# Patient Record
Sex: Female | Born: 1985 | Race: White | Hispanic: No | Marital: Married | Smoking: Never smoker
Health system: Southern US, Community
[De-identification: ages and names within clinical notes are randomized; demographics above are authoritative.]

## PROBLEM LIST (undated history)

## (undated) DIAGNOSIS — E079 Disorder of thyroid, unspecified: Secondary | ICD-10-CM

---

## 2011-07-26 ENCOUNTER — Ambulatory Visit: Payer: Self-pay | Admitting: Pediatrics

## 2012-06-09 ENCOUNTER — Emergency Department: Payer: Self-pay | Admitting: *Deleted

## 2019-06-29 ENCOUNTER — Ambulatory Visit
Admission: EM | Admit: 2019-06-29 | Discharge: 2019-06-29 | Disposition: A | Discharge status: Home / Self Care | Payer: 59 | Attending: Emergency Medicine | Admitting: Emergency Medicine

## 2019-06-29 ENCOUNTER — Other Ambulatory Visit: Payer: Self-pay

## 2019-06-29 DIAGNOSIS — Y288XXA Contact with other sharp object, undetermined intent, initial encounter: Secondary | ICD-10-CM | POA: Diagnosis not present

## 2019-06-29 DIAGNOSIS — Z23 Encounter for immunization: Secondary | ICD-10-CM | POA: Diagnosis not present

## 2019-06-29 DIAGNOSIS — S61215A Laceration without foreign body of left ring finger without damage to nail, initial encounter: Secondary | ICD-10-CM | POA: Diagnosis not present

## 2019-06-29 HISTORY — DX: Disorder of thyroid, unspecified: E07.9

## 2019-06-29 MED ORDER — TETANUS-DIPHTH-ACELL PERTUSSIS 5-2.5-18.5 LF-MCG/0.5 IM SUSP
0.5000 mL | Freq: Once | INTRAMUSCULAR | Status: AC
  Administered 2019-06-29: 15:00:00 0.5 mL via INTRAMUSCULAR

## 2019-06-29 MED ORDER — MUPIROCIN 2 % EX OINT: 1.0000 "application " | TOPICAL_OINTMENT | Freq: Three times a day (TID) | CUTANEOUS | 0 refills | Status: DC

## 2019-06-29 NOTE — Discharge Instructions (Signed)
Keep dry for 24 hours.  Apply mupirocin ointment 3 times daily to the area until sutures are removed in 10 days.  Return to our clinic immediately if any of the signs or symptoms of infection that we discussed occur

## 2019-06-29 NOTE — ED Triage Notes (Signed)
Left hand third digit laceration occurring today. No bleeding at this time. Pt unsure when last tetanus shot was. Cut on grill.

## 2019-06-29 NOTE — ED Provider Notes (Signed)
[facility]    CSN: [id number] Arrival date & time: 06/29/19  1253      History   Chief Complaint Chief Complaint  Patient presents with  . Extremity Laceration    HPI Diane Blankenship is a 33 y.o. female.   HPI  33 year old female presents today after sustaining a laceration to the dorsum of her left nondominant third digit on a smoker grill.  It measures approximately 6 mm with a depth of approximately 1 to 2 mm.  She is not current on her tetanus.       Past Medical History:  Diagnosis Date  . Thyroid disease     There are no active problems to display for this patient.   History reviewed. No pertinent surgical history.  OB History   No obstetric history on file.      Home Medications    Prior to Admission medications   Medication Sig Start Date End Date Taking? Authorizing Provider  mupirocin ointment (BACTROBAN) 2 % Apply 1 application topically 3 (three) times daily. 06/29/19   Lutricia Feiloemer, William P, PA-C    Family History History reviewed. No pertinent family history.  Social History Social History   Tobacco Use  . Smoking status: Not on file  Substance Use Topics  . Alcohol use: Yes    Comment: occasionally  . Drug use: Not on file     Allergies   Patient has no known allergies.   Review of Systems Review of Systems  Constitutional: Positive for activity change. Negative for appetite change, chills, diaphoresis, fatigue and fever.  Skin: Positive for wound.  All other systems reviewed and are negative.    Physical Exam Triage Vital Signs ED Triage Vitals [06/29/19 1332]  Enc Vitals Group     BP (!) 158/104     Pulse Rate (!) 52     Resp 18     Temp 98.2 F (36.8 C)     Temp Source Oral     SpO2 100 %     Weight 220 lb (99.8 kg)     Height 5\' 9"  (1.753 m)     Head Circumference      Peak Flow      Pain Score 4     Pain Loc      Pain Edu?      Excl. in [place]?    No data found.  Updated Vital Signs BP (!)  158/104 (BP Location: Right Arm)   Pulse (!) 52   Temp 98.2 F (36.8 C) (Oral)   Resp 18   Ht 5\' 9"  (1.753 m)   Wt 220 lb (99.8 kg)   SpO2 100%   BMI 32.49 kg/m   Visual Acuity Right Eye Distance:   Left Eye Distance:   Bilateral Distance:    Right Eye Near:   Left Eye Near:    Bilateral Near:     Physical Exam Vitals signs and nursing note reviewed.  Constitutional:      General: She is not in acute distress.    Appearance: Normal appearance. She is not ill-appearing, toxic-appearing or diaphoretic.  HENT:     Head: Normocephalic and atraumatic.  Eyes:     Conjunctiva/sclera: Conjunctivae normal.  Neck:     Musculoskeletal: Normal range of motion and neck supple.  Cardiovascular:     Rate and Rhythm: Normal rate and regular rhythm.     Pulses: Normal pulses.     Heart sounds: Normal heart sounds.  Pulmonary:  Effort: Pulmonary effort is normal.     Breath sounds: Normal breath sounds.  Musculoskeletal: Normal range of motion.        General: Tenderness and signs of injury present.  Skin:    General: Skin is warm and dry.     Comments: Examination of the left nondominant ring finger overlying the middle interphalangeal joint radial aspect.  There is a 6 mm laceration extending approximate 1 mm into the joint itself.  She has a good range of motion of the joint.  To maintain full extension against resistance.  There is no apparent foreign body seen.  Neurovascular function is intact distally.  Neurological:     General: No focal deficit present.     Mental Status: She is alert and oriented to person, place, and time.  Psychiatric:        Mood and Affect: Mood normal.        Behavior: Behavior normal.        Thought Content: Thought content normal.        Judgment: Judgment normal.      UC Treatments / Results  Labs (all labs ordered are listed, but only abnormal results are displayed) Labs Reviewed - No data to display  EKG   Radiology No results  found. .6 Procedures Laceration Repair  Date/Time: 06/29/2019 2:40 PM Performed by: Lutricia Feil, PA-C Authorized by: Lutricia Feil, PA-C   Consent:    Consent obtained:  Verbal   Consent given by:  Patient   Risks discussed:  Infection Anesthesia (see MAR for exact dosages):    Anesthesia method:  Local infiltration   Local anesthetic:  Lidocaine 1% w/o epi Laceration details:    Location:  Finger   Finger location:  L ring finger   Length (cm):  0.6   Depth (mm):  1 Repair type:    Repair type:  Simple Pre-procedure details:    Preparation:  Patient was prepped and draped in usual sterile fashion Exploration:    Hemostasis achieved with:  Direct pressure   Wound exploration: wound explored through full range of motion and entire depth of wound probed and visualized     Contaminated: no   Treatment:    Area cleansed with:  Betadine   Amount of cleaning:  Standard   Irrigation solution:  Sterile saline   Irrigation volume:  60   Irrigation method:  Pressure wash   Visualized foreign bodies/material removed: no   Skin repair:    Repair method:  Sutures   Suture size:  5-0   Suture material:  Prolene   Number of sutures:  2 Approximation:    Approximation:  Close Post-procedure details:    Dressing:  Antibiotic ointment, non-adherent dressing and sterile dressing   Patient tolerance of procedure:  Tolerated well, no immediate complications Comments:     Refer to discharge instructions   (including critical care time)  Medications Ordered in UC Medications  Tdap (BOOSTRIX) injection 0.5 mL (0.5 mLs Intramuscular Given 06/29/19 1435)    Initial Impression / Assessment and Plan / UC Course  I have reviewed the triage vital signs and the nursing notes.  Pertinent labs & imaging results that were available during my care of the patient were reviewed by me and considered in my medical decision making (see chart for details).   33 year old female sustained a  laceration overlying the middle   interphalangeal joint.  Laceration is approximately 6 mm in length with 1 6 mm of  depth.  He was given the option of a adhesive repair versus suturing.  She chose suturing.  It was accomplished without incident please refer to the procedural note.  She was told to keep the area dry for 24 hours.  Then she will begin a normal washing program with application of mupirocin ointment 3 times daily until the sutures are removed in 10 days.  Signs and symptoms of infection were outlined in detail to the patient.  She will return to our clinic or go to the emergency department if any these occur.   Final Clinical Impressions(s) / UC Diagnoses   Final diagnoses:  Laceration of left ring finger without foreign body without damage to nail, initial encounter     Discharge Instructions     Keep dry for 24 hours.  Apply mupirocin ointment 3 times daily to the area until sutures are removed in 10 days.  Return to our clinic immediately if any of the signs or symptoms of infection that we discussed occur    ED Prescriptions    Medication Sig Dispense Auth. Provider   mupirocin ointment (BACTROBAN) 2 % Apply 1 application topically 3 (three) times daily. 22 g Lorin Picket, PA-C     PDMP not reviewed this encounter.   Lorin Picket, PA-C 06/29/19 1525

## 2020-02-09 ENCOUNTER — Other Ambulatory Visit: Payer: Self-pay

## 2020-02-09 ENCOUNTER — Ambulatory Visit
Admission: EM | Admit: 2020-02-09 | Discharge: 2020-02-09 | Disposition: A | Discharge status: Home / Self Care | Payer: 59 | Attending: Family Medicine | Admitting: Family Medicine

## 2020-02-09 ENCOUNTER — Ambulatory Visit (INDEPENDENT_AMBULATORY_CARE_PROVIDER_SITE_OTHER): Payer: 59

## 2020-02-09 ENCOUNTER — Encounter: Payer: Self-pay | Admitting: Emergency Medicine

## 2020-02-09 DIAGNOSIS — M25562 Pain in left knee: Secondary | ICD-10-CM | POA: Diagnosis present

## 2020-02-09 DIAGNOSIS — N760 Acute vaginitis: Secondary | ICD-10-CM | POA: Diagnosis present

## 2020-02-09 DIAGNOSIS — B9689 Other specified bacterial agents as the cause of diseases classified elsewhere: Secondary | ICD-10-CM | POA: Diagnosis present

## 2020-02-09 DIAGNOSIS — A5901 Trichomonal vulvovaginitis: Secondary | ICD-10-CM | POA: Insufficient documentation

## 2020-02-09 LAB — WET PREP, GENITAL
Sperm: NONE SEEN
Yeast Wet Prep HPF POC: NONE SEEN

## 2020-02-09 MED ORDER — METRONIDAZOLE 500 MG PO TABS: 500.0000 mg | ORAL_TABLET | Freq: Two times a day (BID) | ORAL | 0 refills | Status: DC

## 2020-02-09 MED ORDER — MELOXICAM 15 MG PO TABS: 15.0000 mg | ORAL_TABLET | Freq: Every day | ORAL | 0 refills | Status: DC | PRN

## 2020-02-09 NOTE — Discharge Instructions (Signed)
Rest, ice, elevation.  Medication as prescribed.  If continues to be bothersome, see orthopedics.  [facility] Orthopedics [phone]) OR [facility] [phone])  Take care  Dr. Adriana Simas

## 2020-02-09 NOTE — ED Provider Notes (Signed)
[facility]    CSN: [id number] Arrival date & time: 02/09/20  1327      History   Chief Complaint Chief Complaint  Patient presents with  . Knee Pain  . Vaginal Discharge   HPI  34 year old female presents with the above complaints.  Patient reports that she fell through her deck last night.  She states that her left leg fell to the deck.  She injured her left knee and lower leg.  She reports mild swelling and pain.  Pain 3/10 in severity.  She has rested, iced the area with some improvement.  She has an abrasion to the left lower leg.  Additionally, patient reports a 2-week history of vaginal discharge.  Denies itching, pain, odor.  Denies concerns for STDs.  Patient has had trichomonas in the past.  She has had 3 separate encounters where trichomonas was noted.  Testing was negative on 2/25 at the time of IUD insertion.  Patient reports that both she and her partner were treated.  No other complaints at this time.  Past Medical History:  Diagnosis Date  . Thyroid disease     Home Medications    Prior to Admission medications   Medication Sig Start Date End Date Taking? Authorizing Provider  clonazePAM (KLONOPIN) 0.25 MG disintegrating tablet Take by mouth. 07/31/16  Yes [provider]  levonorgestrel (MIRENA) 20 MCG/24HR IUD by Intrauterine route. 11/26/19  Yes [provider]  levothyroxine (SYNTHROID) 75 MCG tablet Take by mouth. 03/13/17  Yes [provider]  sertraline (ZOLOFT) 50 MG tablet Take by mouth. 01/07/20  Yes [provider]  SUMAtriptan (IMITREX) 25 MG tablet TAKE 1 TAB BY MOUTH ONCE AS NEEDED FOR MIGRAINE. MAY REPEAT DOSE AFTER 2 HOURS IF NEEDED 08/05/17  Yes [provider]  traZODone (DESYREL) 100 MG tablet TAKE 1 TABLET BY MOUTH EVERY DAY AT NIGHT 07/28/17  Yes [provider]  meloxicam (MOBIC) 15 MG tablet Take 1 tablet (15 mg total) by mouth daily as needed for pain. 02/09/20   Tommie Sams, DO    metroNIDAZOLE (FLAGYL) 500 MG tablet Take 1 tablet (500 mg total) by mouth 2 (two) times daily. 02/09/20   Tommie Sams, DO  mupirocin ointment (BACTROBAN) 2 % Apply 1 application topically 3 (three) times daily. 06/29/19   Lutricia Feil, PA-C    Social History Social History   Tobacco Use  . Smoking status: Never Smoker  . Smokeless tobacco: Never Used  Substance Use Topics  . Alcohol use: Yes    Comment: occasionally  . Drug use: Never     Allergies   Patient has no known allergies.   Review of Systems Review of Systems  Genitourinary: Positive for vaginal discharge.  Musculoskeletal:       Left knee pain.   Physical Exam Triage Vital Signs ED Triage Vitals  Enc Vitals Group     BP 02/09/20 1343 (!) 158/111     Pulse Rate 02/09/20 1343 66     Resp 02/09/20 1343 18     Temp 02/09/20 1343 98.4 F (36.9 C)     Temp Source 02/09/20 1343 Oral     SpO2 02/09/20 1343 98 %     Weight 02/09/20 1341 230 lb (104.3 kg)     Height 02/09/20 1341 5\' 8"  (1.727 m)     Head Circumference --      Peak Flow --      Pain Score 02/09/20 1340 3  Pain Loc --      Pain Edu? --      Excl. in [place]? --    Updated Vital Signs BP (!) 158/111 (BP Location: Left Arm)   Pulse 66   Temp 98.4 F (36.9 C) (Oral)   Resp 18   Ht 5\' 8"  (1.727 m)   Wt 104.3 kg   SpO2 98%   BMI 34.97 kg/m   Visual Acuity Right Eye Distance:   Left Eye Distance:   Bilateral Distance:    Right Eye Near:   Left Eye Near:    Bilateral Near:     Physical Exam Vitals and nursing note reviewed.  Constitutional:      General: She is not in acute distress.    Appearance: Normal appearance. She is not ill-appearing.  HENT:     Head: Normocephalic and atraumatic.  Eyes:     General:        Right eye: No discharge.        Left eye: No discharge.     Conjunctiva/sclera: Conjunctivae normal.  Pulmonary:     Effort: Pulmonary effort is normal. No respiratory distress.  Musculoskeletal:      Comments: Left knee -ligaments intact.  No joint line tenderness.  Mild swelling.  Abrasions noted anteriorly.  Neurological:     Mental Status: She is alert.  Psychiatric:        Mood and Affect: Mood normal.        Behavior: Behavior normal.    UC Treatments / Results  Labs (all labs ordered are listed, but only abnormal results are displayed) Labs Reviewed  WET PREP, GENITAL - Abnormal; Notable for the following components:      Result Value   Trich, Wet Prep PRESENT (*)    Clue Cells Wet Prep HPF POC PRESENT (*)    WBC, Wet Prep HPF POC FEW (*)    All other components within normal limits    EKG  Radiology   Procedures Procedures (including critical care time)  Medications Ordered in UC Medications - No data to display  Initial Impression / Assessment and Plan / UC Course  I have reviewed the triage vital signs and the nursing notes.  Pertinent labs & imaging results that were available during my care of the patient were reviewed by me and considered in my medical decision making (see chart for details).    34 year old female presents with acute left knee pain after injury, bacterial vaginosis, and trichomonas vaginitis.  X-ray obtained and reviewed independently by me.  Interpretation: No acute fractures of the tibia, fibula, or knee.  Meloxicam as needed for pain.  Advised rest, ice, elevation.  Treating with Flagyl for trichomonas and bacterial vaginosis.  Advised no intercourse until she has been treated and her partner has been tested and treated.  Needs follow-up.  Supportive care.  Final Clinical Impressions(s) / UC Diagnoses   Final diagnoses:  Acute pain of left knee  BV (bacterial vaginosis)  Trichomonas vaginitis     Discharge Instructions     Rest, ice, elevation.  Medication as prescribed.  If continues to be bothersome, see orthopedics.  [place] [phone]) OR [facility] [phone])  Take care  Dr. Lacinda Axon      ED  Prescriptions    Medication Sig Dispense Auth. Provider   metroNIDAZOLE (FLAGYL) 500 MG tablet Take 1 tablet (500 mg total) by mouth 2 (two) times daily. 14 tablet Caramia Boutin G, DO   meloxicam (MOBIC) 15 MG  tablet Take 1 tablet (15 mg total) by mouth daily as needed for pain. 30 tablet Tommie Sams, DO     PDMP not reviewed this encounter.   Tommie Sams, [place] 02/09/20 [phone]

## 2020-02-09 NOTE — ED Triage Notes (Signed)
Patient states she fell through her deck last night injuring her left knee. She states she is having pain with walking. She also is c/o vaginal discharge that started 1.5 weeks ago. She denies itching, denies pain, denies odor. Patient has no concerns for STDS.

## 2020-06-27 ENCOUNTER — Other Ambulatory Visit: Payer: Self-pay | Admitting: Nephrology

## 2020-06-27 ENCOUNTER — Other Ambulatory Visit (HOSPITAL_COMMUNITY): Payer: Self-pay | Admitting: Nephrology

## 2020-06-27 DIAGNOSIS — R809 Proteinuria, unspecified: Secondary | ICD-10-CM

## 2020-06-27 DIAGNOSIS — N182 Chronic kidney disease, stage 2 (mild): Secondary | ICD-10-CM

## 2020-07-06 ENCOUNTER — Ambulatory Visit: Payer: 59

## 2020-07-13 ENCOUNTER — Other Ambulatory Visit: Payer: Self-pay | Admitting: Nephrology

## 2020-07-13 DIAGNOSIS — R809 Proteinuria, unspecified: Secondary | ICD-10-CM

## 2020-07-25 ENCOUNTER — Other Ambulatory Visit: Payer: Self-pay

## 2020-07-25 ENCOUNTER — Encounter
Admission: RE | Admit: 2020-07-25 | Discharge: 2020-07-25 | Disposition: A | Discharge status: Home / Self Care | Payer: 59 | Source: Ambulatory Visit | Attending: Nephrology | Admitting: Nephrology

## 2020-07-25 DIAGNOSIS — Z01812 Encounter for preprocedural laboratory examination: Secondary | ICD-10-CM | POA: Insufficient documentation

## 2020-07-25 LAB — CBC
HCT: 43.5 % (ref 36.0–46.0)
Hemoglobin: 14.1 g/dL (ref 12.0–15.0)
MCH: 30.3 pg (ref 26.0–34.0)
MCHC: 32.4 g/dL (ref 30.0–36.0)
MCV: 93.5 fL (ref 80.0–100.0)
Platelets: 220 10*3/uL (ref 150–400)
RBC: 4.65 MIL/uL (ref 3.87–5.11)
RDW: 12.4 % (ref 11.5–15.5)
WBC: 11.6 10*3/uL — ABNORMAL HIGH (ref 4.0–10.5)
nRBC: 0 % (ref 0.0–0.2)

## 2020-07-25 LAB — COMPREHENSIVE METABOLIC PANEL
ALT: 22 U/L (ref 0–44)
AST: 19 U/L (ref 15–41)
Albumin: 3.6 g/dL (ref 3.5–5.0)
Alkaline Phosphatase: 59 U/L (ref 38–126)
Anion gap: 7 (ref 5–15)
BUN: 11 mg/dL (ref 6–20)
CO2: 27 mmol/L (ref 22–32)
Calcium: 8.9 mg/dL (ref 8.9–10.3)
Chloride: 104 mmol/L (ref 98–111)
Creatinine, Ser: 1.08 mg/dL — ABNORMAL HIGH (ref 0.44–1.00)
GFR, Estimated: >60 mL/min (ref >60)
Glucose, Bld: 99 mg/dL (ref 70–99)
Potassium: 3.6 mmol/L (ref 3.5–5.1)
Sodium: 138 mmol/L (ref 135–145)
Total Bilirubin: 0.9 mg/dL (ref 0.3–1.2)
Total Protein: 6.8 g/dL (ref 6.5–8.1)

## 2020-07-25 LAB — DIFFERENTIAL
Abs Immature Granulocytes: 0.05 10*3/uL (ref 0.00–0.07)
Basophils Absolute: 0.1 10*3/uL (ref 0.0–0.1)
Basophils Relative: 1 %
Eosinophils Absolute: 0.1 10*3/uL (ref 0.0–0.5)
Eosinophils Relative: 1 %
Immature Granulocytes: 0 %
Lymphocytes Relative: 20 %
Lymphs Abs: 2.3 10*3/uL (ref 0.7–4.0)
Monocytes Absolute: 0.5 10*3/uL (ref 0.1–1.0)
Monocytes Relative: 5 %
Neutro Abs: 8.6 10*3/uL — ABNORMAL HIGH (ref 1.7–7.7)
Neutrophils Relative %: 73 %

## 2020-07-25 LAB — URINALYSIS, ROUTINE W REFLEX MICROSCOPIC
Bacteria, UA: NONE SEEN
Bilirubin Urine: NEGATIVE
Glucose, UA: NEGATIVE mg/dL
Hgb urine dipstick: NEGATIVE
Ketones, ur: NEGATIVE mg/dL
Leukocytes,Ua: NEGATIVE
Nitrite: NEGATIVE
Protein, ur: >=300 mg/dL — AB
Specific Gravity, Urine: 1.019 (ref 1.005–1.030)
pH: 7 (ref 5.0–8.0)

## 2020-07-25 LAB — TYPE AND SCREEN
ABO/RH(D): A POS
Antibody Screen: NEGATIVE

## 2020-07-25 LAB — PROTEIN / CREATININE RATIO, URINE
Creatinine, Urine: 161 mg/dL
Protein Creatinine Ratio: 3.42 mg/mg{Cre} — ABNORMAL HIGH (ref 0.00–0.15)
Total Protein, Urine: 550 mg/dL

## 2020-07-25 LAB — PROTIME-INR
INR: 0.9 (ref 0.8–1.2)
Prothrombin Time: 12 seconds (ref 11.4–15.2)

## 2020-07-27 ENCOUNTER — Observation Stay: Payer: 59

## 2020-07-27 ENCOUNTER — Observation Stay
Admission: RE | Admit: 2020-07-27 | Discharge: 2020-07-28 | Disposition: A | Discharge status: Home / Self Care | Payer: 59 | Source: Ambulatory Visit | Attending: Nephrology | Admitting: Nephrology

## 2020-07-27 ENCOUNTER — Ambulatory Visit
Admission: RE | Admit: 2020-07-27 | Discharge: 2020-07-27 | Disposition: A | Discharge status: Home / Self Care | Payer: 59 | Source: Ambulatory Visit | Attending: Nephrology | Admitting: Nephrology

## 2020-07-27 ENCOUNTER — Other Ambulatory Visit: Payer: Self-pay

## 2020-07-27 ENCOUNTER — Ambulatory Visit: Payer: 59

## 2020-07-27 ENCOUNTER — Encounter: Payer: Self-pay | Admitting: Nephrology

## 2020-07-27 DIAGNOSIS — R809 Proteinuria, unspecified: Principal | ICD-10-CM | POA: Diagnosis present

## 2020-07-27 DIAGNOSIS — Z20822 Contact with and (suspected) exposure to covid-19: Secondary | ICD-10-CM | POA: Insufficient documentation

## 2020-07-27 LAB — SARS CORONAVIRUS 2 BY RT PCR (HOSPITAL ORDER, PERFORMED IN ~~LOC~~ HOSPITAL LAB): SARS Coronavirus 2: NEGATIVE

## 2020-07-27 LAB — ABO/RH: ABO/RH(D): A POS

## 2020-07-27 LAB — CBC
HCT: 41.6 % (ref 36.0–46.0)
Hemoglobin: 13.9 g/dL (ref 12.0–15.0)
MCH: 30.9 pg (ref 26.0–34.0)
MCHC: 33.4 g/dL (ref 30.0–36.0)
MCV: 92.4 fL (ref 80.0–100.0)
Platelets: 204 10*3/uL (ref 150–400)
RBC: 4.5 MIL/uL (ref 3.87–5.11)
RDW: 12.4 % (ref 11.5–15.5)
WBC: 13.7 10*3/uL — ABNORMAL HIGH (ref 4.0–10.5)
nRBC: 0 % (ref 0.0–0.2)

## 2020-07-27 MED ORDER — HYDRALAZINE HCL 20 MG/ML IJ SOLN
10.0000 mg | INTRAMUSCULAR | Status: DC | PRN
  Administered 2020-07-27: 10 mg via INTRAVENOUS

## 2020-07-27 MED ORDER — LABETALOL HCL 5 MG/ML IV SOLN
INTRAVENOUS | Status: AC
  Administered 2020-07-27: 10 mg via INTRAVENOUS
  Filled 2020-07-27: qty 4

## 2020-07-27 MED ORDER — LEVOTHYROXINE SODIUM 75 MCG PO TABS
75.0000 ug | ORAL_TABLET | Freq: Every day | ORAL | Status: DC
  Administered 2020-07-28: 75 ug via ORAL
  Filled 2020-07-27: qty 1

## 2020-07-27 MED ORDER — SODIUM CHLORIDE 0.45 % IV SOLN: INTRAVENOUS | Status: DC

## 2020-07-27 MED ORDER — LOSARTAN POTASSIUM 25 MG PO TABS
25.0000 mg | ORAL_TABLET | Freq: Every day | ORAL | Status: DC
  Administered 2020-07-27: 25 mg via ORAL
  Filled 2020-07-27 (×2): qty 1

## 2020-07-27 MED ORDER — HYDROCODONE-ACETAMINOPHEN 5-325 MG PO TABS
1.0000 | ORAL_TABLET | ORAL | Status: DC | PRN
  Administered 2020-07-27: 2 via ORAL

## 2020-07-27 MED ORDER — HYDROCODONE-ACETAMINOPHEN 5-325 MG PO TABS
ORAL_TABLET | ORAL | Status: AC
  Administered 2020-07-27: 1 via ORAL
  Administered 2020-07-27: 1
  Administered 2020-07-28: 2 via ORAL
  Filled 2020-07-27: qty 1

## 2020-07-27 MED ORDER — HYDRALAZINE HCL 20 MG/ML IJ SOLN
INTRAMUSCULAR | Status: AC
  Administered 2020-07-27: 10 mg via INTRAVENOUS
  Filled 2020-07-27: qty 1

## 2020-07-27 MED ORDER — SODIUM CHLORIDE 0.9 % IV SOLN
8.0000 mg | Freq: Three times a day (TID) | INTRAVENOUS | Status: DC | PRN
  Administered 2020-07-27 (×2): 8 mg via INTRAVENOUS
  Filled 2020-07-27 (×2): qty 4

## 2020-07-27 MED ORDER — LABETALOL HCL 5 MG/ML IV SOLN: 10.0000 mg | Freq: Once | INTRAVENOUS | Status: AC

## 2020-07-27 MED ORDER — AMLODIPINE BESYLATE 5 MG PO TABS
5.0000 mg | ORAL_TABLET | Freq: Every day | ORAL | Status: DC
  Administered 2020-07-27 – 2020-07-28 (×2): 5 mg via ORAL
  Filled 2020-07-27 (×2): qty 1

## 2020-07-27 MED ORDER — HYDROCODONE-ACETAMINOPHEN 5-325 MG PO TABS
ORAL_TABLET | ORAL | Status: AC
  Filled 2020-07-27: qty 1

## 2020-07-27 MED ORDER — HYDRALAZINE HCL 20 MG/ML IJ SOLN
10.0000 mg | Freq: Once | INTRAMUSCULAR | Status: AC
  Administered 2020-07-27: 10 mg via INTRAVENOUS

## 2020-07-27 MED ORDER — MIDAZOLAM HCL 2 MG/2ML IJ SOLN
INTRAMUSCULAR | Status: AC
  Administered 2020-07-27: 1 mg
  Filled 2020-07-27: qty 2

## 2020-07-27 MED ORDER — SUMATRIPTAN SUCCINATE 50 MG PO TABS
50.0000 mg | ORAL_TABLET | Freq: Once | ORAL | Status: AC
  Administered 2020-07-27: 50 mg via ORAL
  Filled 2020-07-27: qty 1

## 2020-07-27 MED ORDER — HYDRALAZINE HCL 20 MG/ML IJ SOLN
INTRAMUSCULAR | Status: AC
  Filled 2020-07-27: qty 1

## 2020-07-27 MED ORDER — HYDRALAZINE HCL 20 MG/ML IJ SOLN: 10.0000 mg | Freq: Once | INTRAMUSCULAR | Status: AC

## 2020-07-27 MED ORDER — ONDANSETRON HCL 4 MG/2ML IJ SOLN
INTRAMUSCULAR | Status: AC
  Filled 2020-07-27: qty 4

## 2020-07-27 MED ORDER — SODIUM CHLORIDE 0.9 % IV SOLN: INTRAVENOUS | Status: DC

## 2020-07-27 MED ORDER — MIDAZOLAM HCL 2 MG/2ML IJ SOLN
1.0000 mg | Freq: Once | INTRAMUSCULAR | Status: DC
Start: 2020-07-27 — End: 2020-07-27

## 2020-07-27 MED ORDER — SERTRALINE HCL 50 MG PO TABS
50.0000 mg | ORAL_TABLET | Freq: Every day | ORAL | Status: DC
  Administered 2020-07-27: 50 mg via ORAL
  Filled 2020-07-27: qty 1

## 2020-07-27 MED ORDER — HYDROCODONE-ACETAMINOPHEN 5-325 MG PO TABS
ORAL_TABLET | ORAL | Status: AC
  Filled 2020-07-27: qty 2

## 2020-07-27 MED ORDER — CLONIDINE HCL 0.1 MG PO TABS
0.1000 mg | ORAL_TABLET | Freq: Once | ORAL | Status: AC
  Administered 2020-07-27: 0.1 mg via ORAL
  Filled 2020-07-27: qty 1

## 2020-07-27 MED ORDER — TRAZODONE HCL 100 MG PO TABS
100.0000 mg | ORAL_TABLET | Freq: Every day | ORAL | Status: DC
  Administered 2020-07-27: 100 mg via ORAL
  Filled 2020-07-27: qty 1

## 2020-07-27 NOTE — Plan of Care (Signed)

## 2020-07-27 NOTE — Procedures (Signed)
After obtaining informed consent, the patient was brought down to the ultrasound suite. Subsequently the left kidney was identified under ultrasound. The left flank was prepped and draped in standard sterile fashion. Local anesthesia was achieved using 1% lidocaine. Subsequently using an 18-gauge biopsy device and ultraound guidance, a total of 3 passes were made into the left kidney. The specimens were submitted to pathology and deemed to be adequate for submission. No active bleeding noted on post-biopsy images.   The patient tolerated the procedure very well. She will return to herroom for continued observation.   Estimated blood loss: Minimal.  Complications: No immediate complications. 

## 2020-07-27 NOTE — Progress Notes (Signed)
This patient was pre admitted for Renal Biopsy per Dr Cherylann Ratel, asked to assist with procedure for BP stability. Patient given total of hydralazine 30mg  along with labetalol 10mg ,versed 1mg ,and clonidine 0.1 mg per Dr orders for procedure. Tolerated well,with bp as noted,stable at this time. Denies complaints at this time. Patient to go to SDS to finish recovery and await for bed placement. Awake/alert and oriented post procedure. No evidence of bleeding post procedure at this time. bandade dry/intact.

## 2020-07-27 NOTE — Progress Notes (Signed)
Notified Dr. Cherylann Ratel of pt's hypertension. Order received for hydralazine, amlodipine and losartan.

## 2020-07-27 NOTE — Progress Notes (Signed)
Patient here needing to have renal U/S biopsy.  No Covid test done prior to admit so paient was brought to SDS for covid testing and ABO blood test.  Many conversations held wtoih Spec[facility] Recovery and radiology.  Per Selena Batten in radiology patient is scheduled for procedure at 10am.  Transferring to admissions via wheelchair to have patient admitted, she will go directly to U/S and then to be admitted for observation following.

## 2020-07-28 DIAGNOSIS — R809 Proteinuria, unspecified: Secondary | ICD-10-CM | POA: Diagnosis not present

## 2020-07-28 LAB — CBC
HCT: 40.5 % (ref 36.0–46.0)
Hemoglobin: 13.4 g/dL (ref 12.0–15.0)
MCH: 31.1 pg (ref 26.0–34.0)
MCHC: 33.1 g/dL (ref 30.0–36.0)
MCV: 94 fL (ref 80.0–100.0)
Platelets: 195 10*3/uL (ref 150–400)
RBC: 4.31 MIL/uL (ref 3.87–5.11)
RDW: 12.9 % (ref 11.5–15.5)
WBC: 11.5 10*3/uL — ABNORMAL HIGH (ref 4.0–10.5)
nRBC: 0 % (ref 0.0–0.2)

## 2020-07-28 MED ORDER — HYDROCODONE-ACETAMINOPHEN 5-325 MG PO TABS
ORAL_TABLET | ORAL | Status: AC
  Filled 2020-07-28: qty 2

## 2020-07-28 MED ORDER — SUMATRIPTAN SUCCINATE 50 MG PO TABS
50.0000 mg | ORAL_TABLET | Freq: Once | ORAL | Status: AC
  Administered 2020-07-28: 50 mg via ORAL
  Filled 2020-07-28: qty 1

## 2020-07-28 MED ORDER — HYDROCODONE-ACETAMINOPHEN 5-325 MG PO TABS
ORAL_TABLET | ORAL | Status: AC
  Administered 2020-07-28: 2 via ORAL
  Filled 2020-07-28: qty 2

## 2020-07-28 MED ORDER — AMLODIPINE BESYLATE 5 MG PO TABS: 5.0000 mg | ORAL_TABLET | Freq: Every day | ORAL | 12 refills | Status: AC

## 2020-07-28 NOTE — Progress Notes (Signed)
Nsg Discharge Note  Admit Date:  07/27/2020 Discharge date: 07/28/2020   Diane Blankenship to be D/C'd Home per MD order.  AVS completed.  Copy for chart, and copy for patient signed, and dated. Patient/caregiver able to verbalize understanding.  Discharge Medication: Allergies as of 07/28/2020   No Known Allergies     Medication List    STOP taking these medications   meloxicam 15 MG tablet Commonly known as: MOBIC   metroNIDAZOLE 500 MG tablet Commonly known as: FLAGYL   mupirocin ointment 2 % Commonly known as: Bactroban     TAKE these medications   amLODipine 5 MG tablet Commonly known as: NORVASC Take 1 tablet (5 mg total) by mouth daily.   clonazePAM 0.25 MG disintegrating tablet Commonly known as: KLONOPIN Take by mouth.   levonorgestrel 20 MCG/24HR IUD Commonly known as: MIRENA by Intrauterine route.   levothyroxine 75 MCG tablet Commonly known as: SYNTHROID Take by mouth.   losartan 25 MG tablet Commonly known as: COZAAR Take 25 mg by mouth daily.   sertraline 50 MG tablet Commonly known as: ZOLOFT Take by mouth.   SUMAtriptan 25 MG tablet Commonly known as: IMITREX TAKE 1 TAB BY MOUTH ONCE AS NEEDED FOR MIGRAINE. MAY REPEAT DOSE AFTER 2 HOURS IF NEEDED   traZODone 100 MG tablet Commonly known as: DESYREL TAKE 1 TABLET BY MOUTH EVERY DAY AT NIGHT       Discharge Assessment: Vitals:   07/28/20 0830 07/28/20 0918  BP: (!) 144/93 (!) 144/93  Pulse: 89   Resp: 16   Temp:    SpO2: 97%    Skin clean, dry and intact without evidence of skin break down, no evidence of skin tears noted. IV catheter discontinued intact. Site without signs and symptoms of complications - no redness or edema noted at insertion site, patient denies c/o pain - only slight tenderness at site.  Dressing with slight pressure applied.  D/c Instructions-Education: Discharge instructions given to patient/family with verbalized understanding. D/c education completed with  patient/family including follow up instructions, medication list, d/c activities limitations if indicated, with other d/c instructions as indicated by MD - patient able to verbalize understanding, all questions fully answered. Patient instructed to return to ED, call 911, or call MD for any changes in condition.  Patient escorted via WC, and D/C home via private auto.  Theodore Demark, RN 07/28/2020 9:28 AM

## 2020-07-28 NOTE — Discharge Instructions (Signed)
No heavy lifting greater than 5 lbs x 2 weeks.  Return to hospital for blood in the urine that does subside, or for severe left flank pain.  Keep follow up appointment with Dr. Cherylann Ratel

## 2020-07-28 NOTE — Discharge Summary (Signed)
Physician Discharge Summary  Patient ID: Diane Blankenship MRN: [id number] DOB/AGE: [birth year] 34 y.o.  Admit date: 07/27/2020 Discharge date: 07/28/2020  Admission Diagnoses:  Discharge Diagnoses:  Active Problems:   Proteinuria   Discharged Condition: stable  Hospital Course: Patient came in for elective percutaneous ultrasound guided left renal biopsy on 07/27/20.  This was performed successfully, samples sent to [facility].  Patient tolerated the procedure well with local anesthetic and mild sedation with versed.  Did develop post operative migraine treated with imitrex.  Feeling well on morning of discharge, no hematuria, very mild left flank pain.  No heavy lifting greater than 5lbs x 2 weeks advised.   Consults: None  Significant Diagnostic Studies: Ultrasound guided left renal biopsy.  Treatments: Treatment for perioperative hypertension and migraine headaches given.  Discharge Exam: Blood pressure 131/84, pulse 85, temperature 98.3 F (36.8 C), temperature source Temporal, resp. rate 16, height 5\' 8"  (1.727 m), weight 104.3 kg, SpO2 95 %. See note from 07/28/20.   Disposition: Discharge to home.    Allergies as of 07/28/2020   No Known Allergies     Medication List    STOP taking these medications   meloxicam 15 MG tablet Commonly known as: MOBIC   metroNIDAZOLE 500 MG tablet Commonly known as: FLAGYL   mupirocin ointment 2 % Commonly known as: Bactroban     TAKE these medications   amLODipine 5 MG tablet Commonly known as: NORVASC Take 1 tablet (5 mg total) by mouth daily.   clonazePAM 0.25 MG disintegrating tablet Commonly known as: KLONOPIN Take by mouth.   levonorgestrel 20 MCG/24HR IUD Commonly known as: MIRENA by Intrauterine route.   levothyroxine 75 MCG tablet Commonly known as: SYNTHROID Take by mouth.   losartan 25 MG tablet Commonly known as: COZAAR Take 25 mg by mouth daily.   sertraline 50 MG tablet Commonly known as:  ZOLOFT Take by mouth.   SUMAtriptan 25 MG tablet Commonly known as: IMITREX TAKE 1 TAB BY MOUTH ONCE AS NEEDED FOR MIGRAINE. MAY REPEAT DOSE AFTER 2 HOURS IF NEEDED   traZODone 100 MG tablet Commonly known as: DESYREL TAKE 1 TABLET BY MOUTH EVERY DAY AT NIGHT        Signed: Jerolene Kupfer 07/28/2020, 8:59 AM

## 2020-07-28 NOTE — Progress Notes (Signed)
[facility]  ROUNDING NOTE   Subjective:  Pt having headache this AM. To be administerd imitrex. Minimal flank pain at the moment.  No reported hematuria.    Objective:  Vital signs in last 24 hours:  Temp:  [98.2 F (36.8 C)-98.3 F (36.8 C)] 98.3 F (36.8 C) (10/28 0053) Pulse Rate:  [63-110] 85 (10/28 0625) Resp:  [16-20] 16 (10/28 0625) BP: (89-161)/(51-102) 131/84 (10/28 0625) SpO2:  [93 %-100 %] 95 % (10/28 0625) Weight:  [104.3 kg] 104.3 kg (10/27 1448)  Weight change:  Filed Weights   07/27/20 1448  Weight: 104.3 kg    Intake/Output: I/O last 3 completed shifts: In: 636 [P.O.:25; I.V.:507; IV Piggyback:104] Out: -    Intake/Output this shift:  No intake/output data recorded.  Physical Exam: General:  No acute distress  Head:  Normocephalic, atraumatic. Moist oral mucosal membranes  Eyes:  Anicteric  Neck:  Supple  Lungs:   Clear to auscultation, normal effort  Heart:  S1S2 no rubs  Abdomen:   Soft, nontender, bowel sounds present, no bruising overlying left flank.  Extremities:  No peripheral edema.  Neurologic:  Awake, alert, following commands  Skin:  No lesions       Basic Metabolic Panel: Recent Labs  Lab 07/25/20 1000  NA 138  K 3.6  CL 104  CO2 27  GLUCOSE 99  BUN 11  CREATININE 1.08*  CALCIUM 8.9    Liver Function Tests: Recent Labs  Lab 07/25/20 1000  AST 19  ALT 22  ALKPHOS 59  BILITOT 0.9  PROT 6.8  ALBUMIN 3.6   No results for input(s): LIPASE, AMYLASE in the last 168 hours. No results for input(s): AMMONIA in the last 168 hours.  CBC: Recent Labs  Lab 07/25/20 1000 07/27/20 1642 07/28/20 0648  WBC 11.6* 13.7* 11.5*  NEUTROABS 8.6*  --   --   HGB 14.1 13.9 13.4  HCT 43.5 41.6 40.5  MCV 93.5 92.4 94.0  PLT 220 204 195    Cardiac Enzymes: No results for input(s): CKTOTAL, CKMB, CKMBINDEX, TROPONINI in the last 168 hours.  BNP: Invalid input(s): POCBNP  CBG: No results for input(s): GLUCAP  in the last 168 hours.  Microbiology: Results for orders placed or performed during the hospital encounter of 02/09/20  Wet prep, genital     Status: Abnormal   Collection Time: 02/09/20  1:44 PM   Specimen: Vaginal  Result Value Ref Range Status   Yeast Wet Prep HPF POC NONE SEEN NONE SEEN Final   Trich, Wet Prep PRESENT (A) NONE SEEN Final   Clue Cells Wet Prep HPF POC PRESENT (A) NONE SEEN Final   WBC, Wet Prep HPF POC FEW (A) NONE SEEN Final   Sperm NONE SEEN  Final    Comment: Performed at [facility], [id number], [place], [place] [address]    Coagulation Studies: Recent Labs    07/25/20 1000  LABPROT 12.0  INR 0.9    Urinalysis: Recent Labs    07/25/20 1011  COLORURINE YELLOW*  LABSPEC 1.019  PHURINE 7.0  GLUCOSEU NEGATIVE  HGBUR NEGATIVE  BILIRUBINUR NEGATIVE  KETONESUR NEGATIVE  PROTEINUR >=300*  NITRITE NEGATIVE  LEUKOCYTESUR NEGATIVE      Imaging: US Biopsy-No Radiologist  Result Date: 07/27/2020 CLINICAL DATA:  34 year old with proteinuria. Left renal biopsy was performed by Dr. Cherylann Ratel with ultrasound guidance. EXAM: RENAL ULTRASOUND LIMITED TECHNIQUE: Ultrasound images were obtained during percutaneous biopsy. COMPARISON:  None. FINDINGS: Left kidney measures  roughly 12.4 cm in length without hydronephrosis. Biopsies were performed in the left kidney lower pole cortex region. No evidence for large hematoma or pseudoaneurysm on the post biopsy images. IMPRESSION: Ultrasound images were used for percutaneous biopsy. No large hematoma on the post biopsy images. Electronically Signed   By: Richarda Overlie M.D.   On: 07/27/2020 12:36     Medications:   . ondansetron (ZOFRAN) IV Stopped (07/27/20 2225)   . amLODipine  5 mg Oral Daily  . levothyroxine  75 mcg Oral QAC breakfast  . losartan  25 mg Oral Daily  . sertraline  50 mg Oral Daily  . traZODone  100 mg Oral QHS   hydrALAZINE, HYDROcodone-acetaminophen, ondansetron (ZOFRAN)  IV  Assessment/ Plan:  34 y.o. female with past medical history of HTN, migraine headaches, hypothyroidism who presented for elective percutaenous US guided left renal biopsy.  1.  Proteinuria. 2.  CKD stage II.  Plan:  Patient underwent percutaneous ultrasound guided left renal biopsy yesterday.  Procedure went well, pt overall tolerated well.  Doing well this AM other than migraine headache.  To be administered another dose of imitrex.  Minimal flank pain now, no hematuria.  Will plan for DC home with close follow up.  No heavy lifting more than 5 lbs x 2 weeks, may resume driving on Monday.  Pt advised to return to hospital if severe left flank develops.  Stable for discharge to home.    LOS: 0 Fred Hammes 10/28/20218:50 AM

## 2020-08-03 ENCOUNTER — Encounter: Payer: Self-pay | Admitting: Nephrology

## 2020-08-03 LAB — SURGICAL PATHOLOGY

## 2021-04-03 IMAGING — CR DG TIBIA/FIBULA 2V*L*
3 series · 3 of 3 positions shown · non-contrast
Comparison: None.

CLINICAL DATA: Left leg pain after fall

EXAM:
LEFT KNEE - COMPLETE 4+ VIEW; LEFT TIBIA AND FIBULA - 2 VIEW

[tibia ap (1 of 2)]
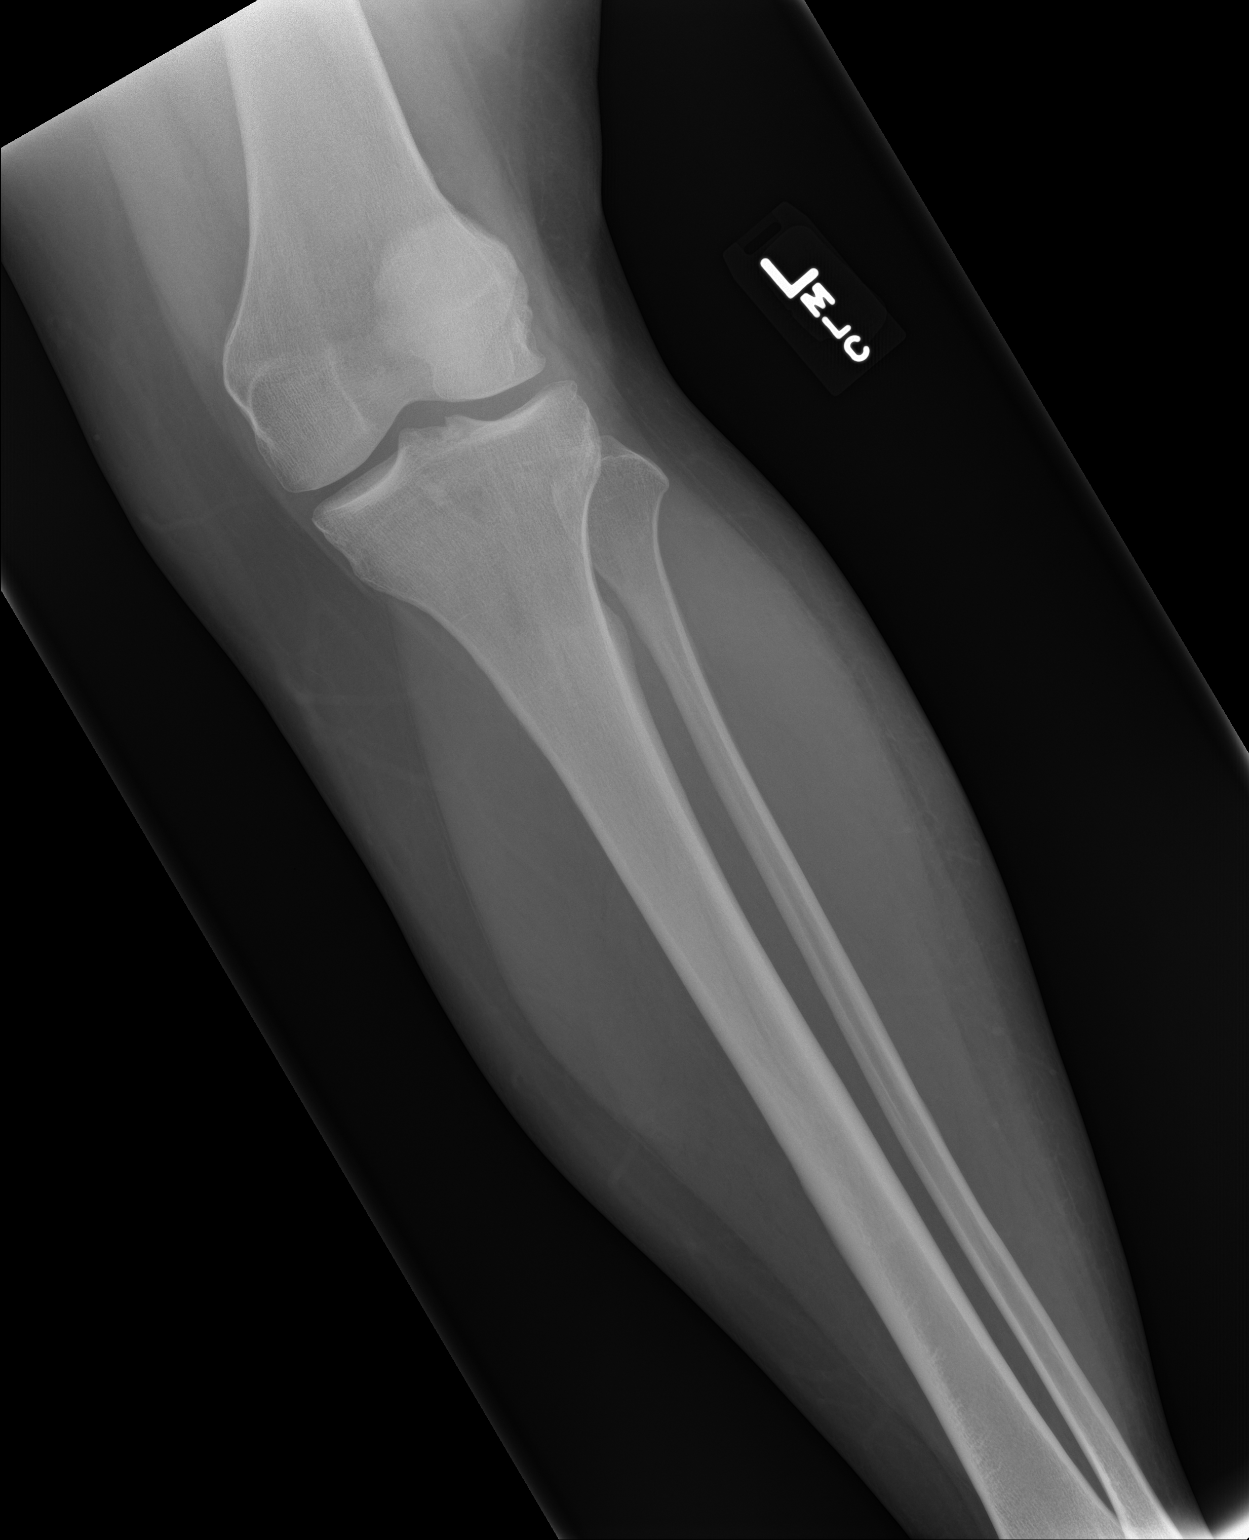

[tibia ap (2 of 2)]
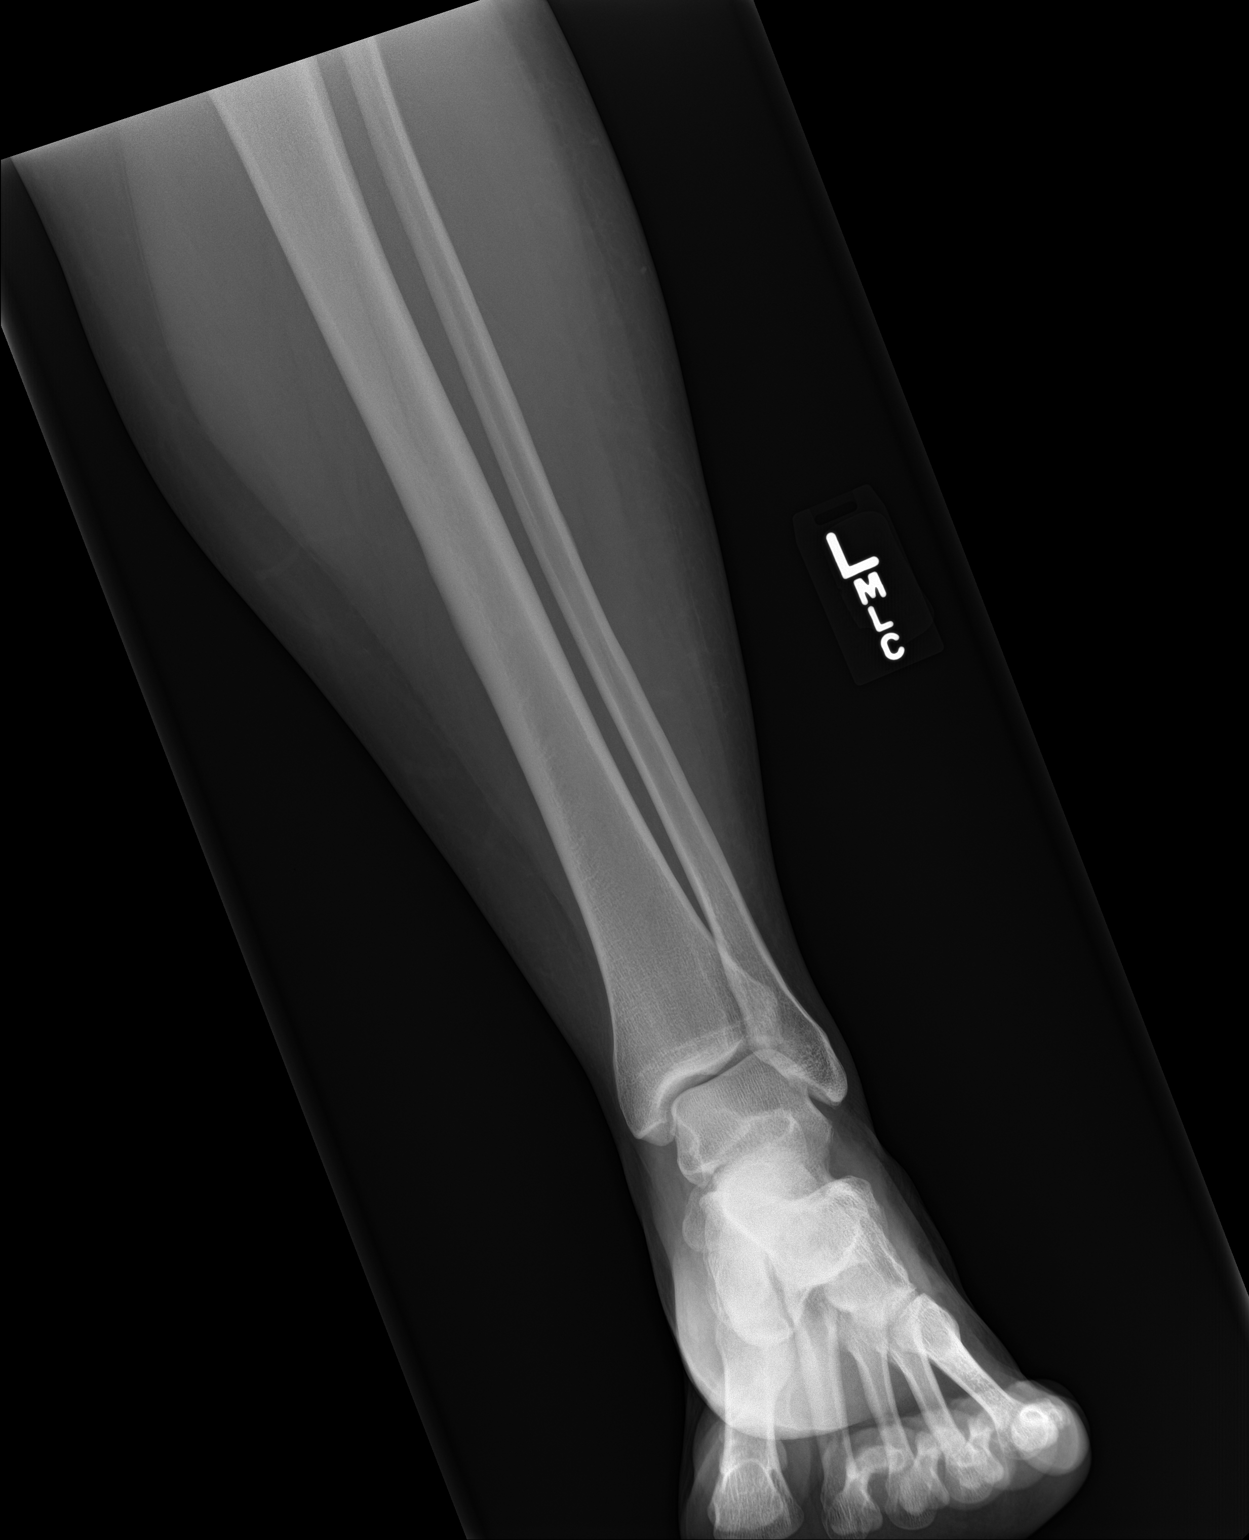

[tibia lat]
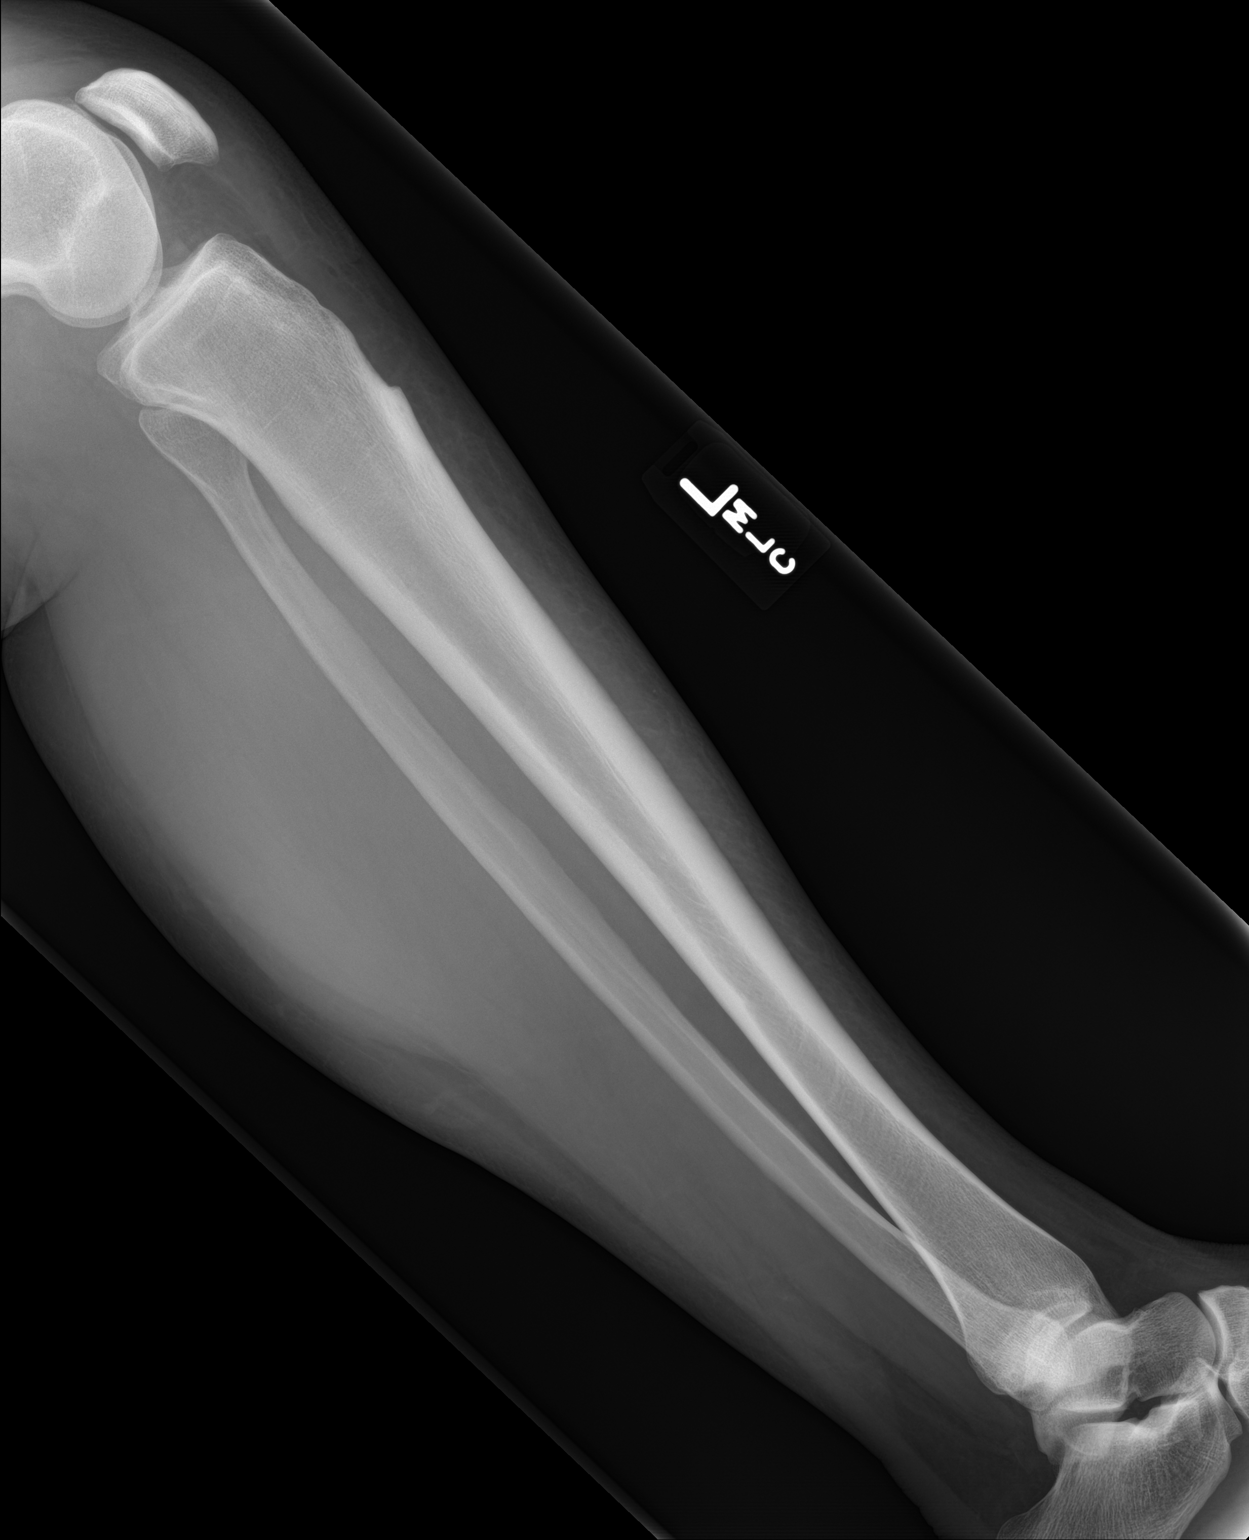

[3 of 3 positions shown; findings below may reference images not displayed]

FINDINGS: No evidence of fracture, dislocation, or joint effusion. Alignment
at the ankle remains anatomic. No evidence of arthropathy or other
focal bone abnormality. Soft tissues are unremarkable.
IMPRESSION: No acute osseous abnormality of the left tibia-fibula or left knee.

## 2021-09-19 IMAGING — US US BIOPSY
1 series · 10 of 10 positions shown · non-contrast
Comparison: None.

CLINICAL DATA: 34-year-old with proteinuria. Left renal biopsy was
performed by Dr. Kiros with ultrasound guidance.

EXAM:
RENAL ULTRASOUND LIMITED
TECHNIQUE: Ultrasound images were obtained during percutaneous biopsy.

[Series 1: us biopsy · 0.21mm/px · 10 of 10 slices shown]
[im 1/10]
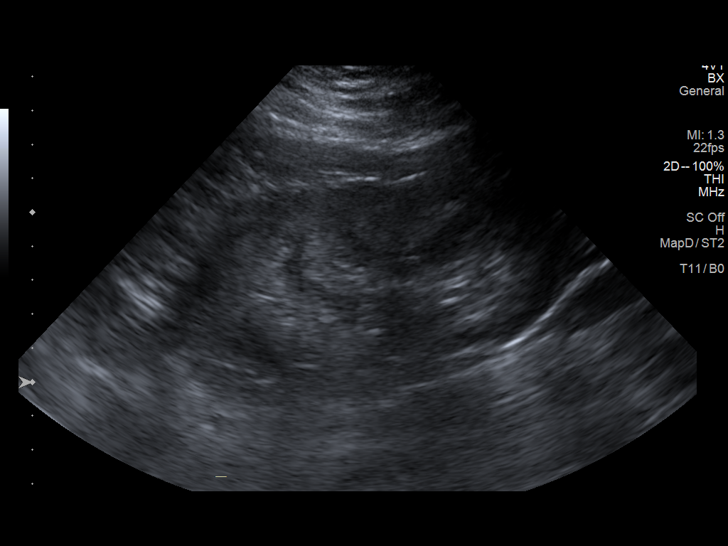
[im 2/10]
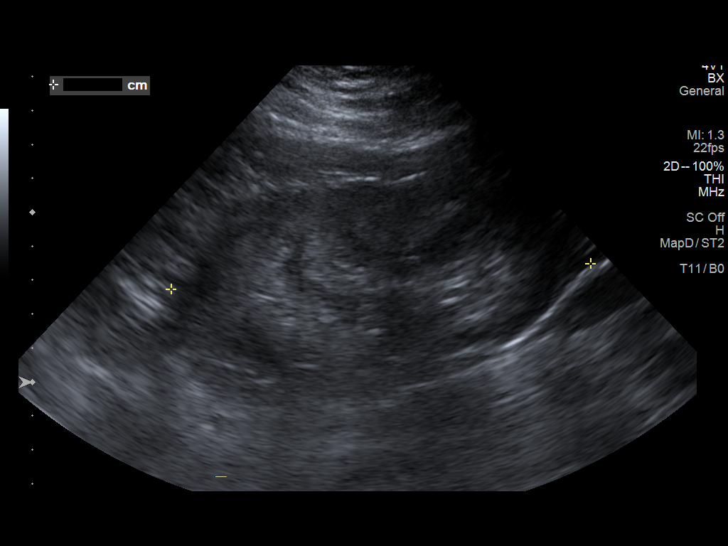
[im 3/10]
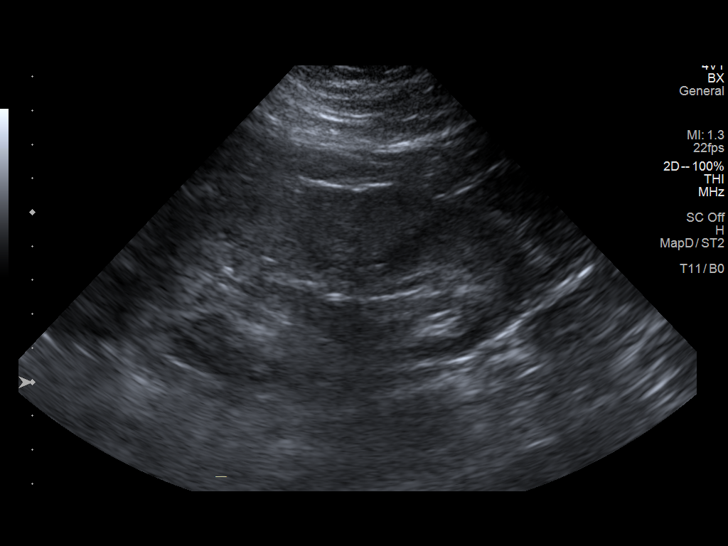
[im 4/10]
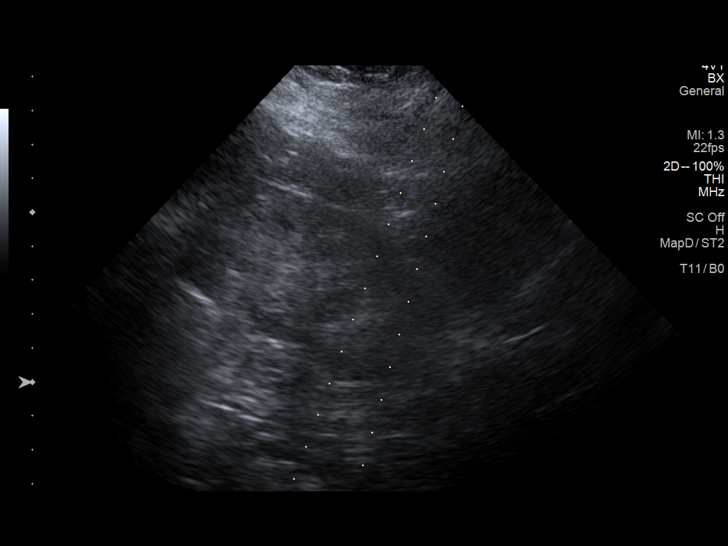
[im 5/10]
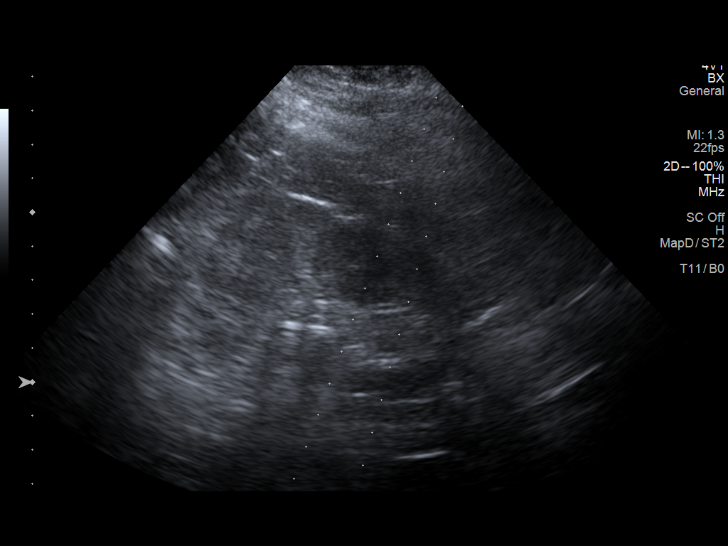
[im 6/10]
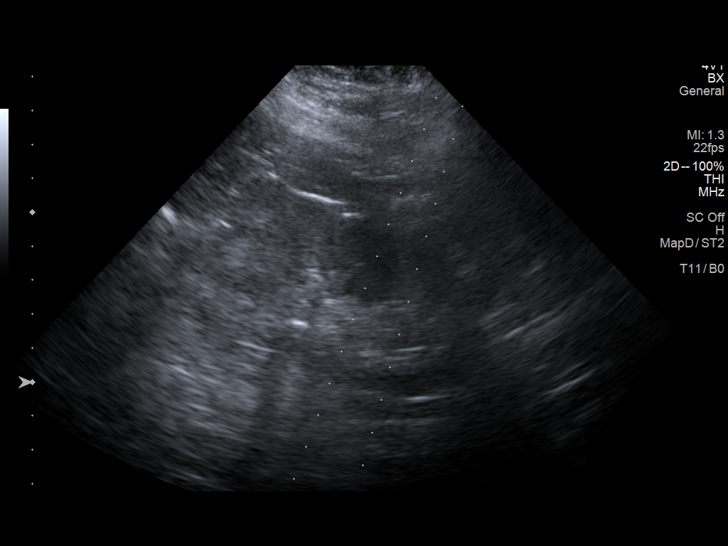
[im 7/10]
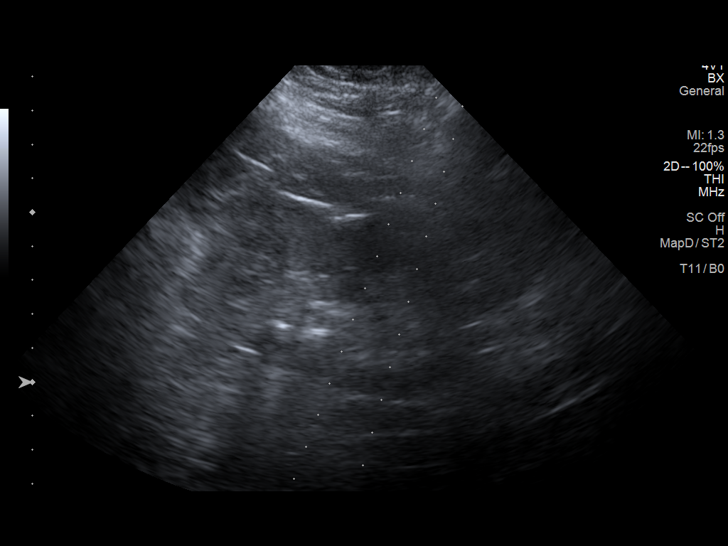
[im 8/10]
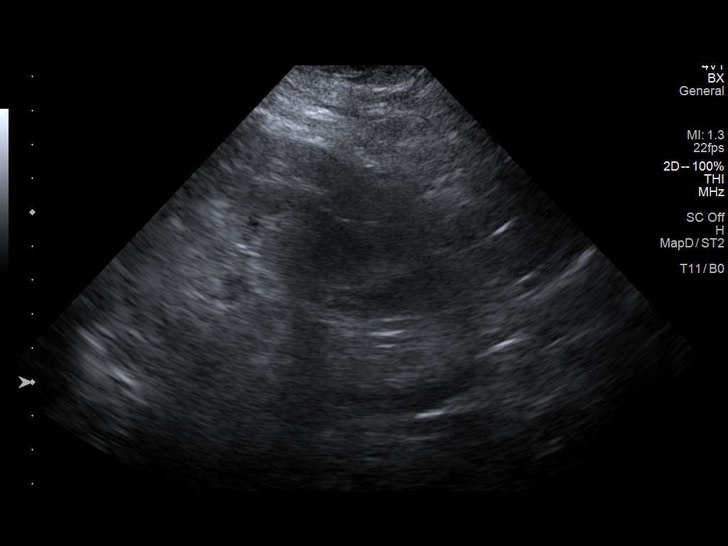
[im 9/10]
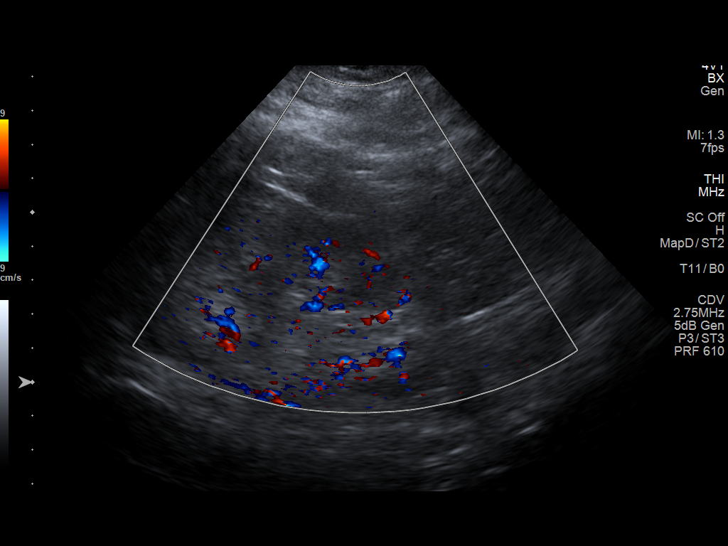
[im 10/10]
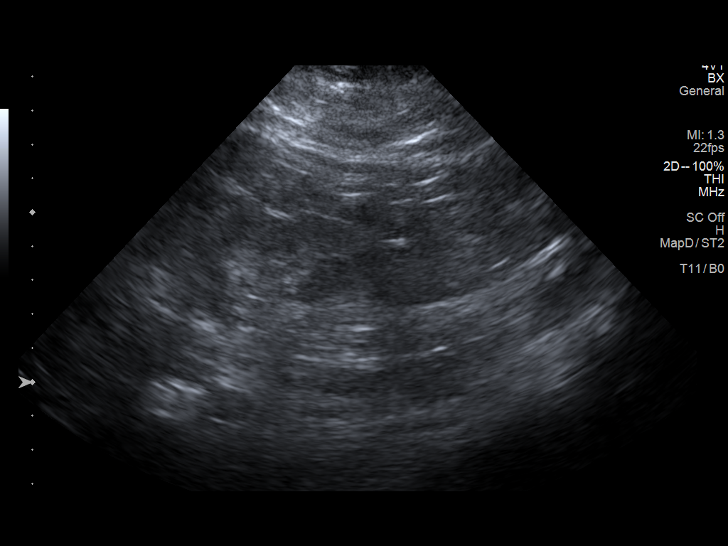

[10 of 10 positions shown; findings below may reference images not displayed]

FINDINGS: Left kidney measures roughly 12.4 cm in length without
hydronephrosis. Biopsies were performed in the left kidney lower
pole cortex region. No evidence for large hematoma or pseudoaneurysm
on the post biopsy images.
IMPRESSION: Ultrasound images were used for percutaneous biopsy. No large
hematoma on the post biopsy images.
